# Patient Record
Sex: Female | Born: 1960 | Race: White | Hispanic: No | Marital: Married | Smoking: Never smoker
Health system: Southern US, Community
[De-identification: ages and names within clinical notes are randomized; demographics above are authoritative.]

## PROBLEM LIST (undated history)

## (undated) DIAGNOSIS — M1990 Unspecified osteoarthritis, unspecified site: Secondary | ICD-10-CM

## (undated) HISTORY — PX: ABDOMINAL HYSTERECTOMY: SHX81

## (undated) HISTORY — PX: HAND SURGERY: SHX662

---

## 2008-07-23 ENCOUNTER — Emergency Department (HOSPITAL_BASED_OUTPATIENT_CLINIC_OR_DEPARTMENT_OTHER): Admission: EM | Admit: 2008-07-23 | Discharge: 2008-07-23 | Payer: Self-pay | Admitting: Emergency Medicine

## 2008-07-25 ENCOUNTER — Ambulatory Visit (HOSPITAL_BASED_OUTPATIENT_CLINIC_OR_DEPARTMENT_OTHER): Admission: RE | Admit: 2008-07-25 | Discharge: 2008-07-25 | Payer: Self-pay | Admitting: Orthopedic Surgery

## 2008-12-01 ENCOUNTER — Ambulatory Visit: Payer: Self-pay | Admitting: Family Medicine

## 2008-12-01 DIAGNOSIS — N63 Unspecified lump in unspecified breast: Secondary | ICD-10-CM | POA: Insufficient documentation

## 2011-06-02 LAB — URINALYSIS, ROUTINE W REFLEX MICROSCOPIC
Glucose, UA: NEGATIVE
Hgb urine dipstick: NEGATIVE
Specific Gravity, Urine: 1.021

## 2017-03-24 ENCOUNTER — Encounter: Payer: Self-pay | Admitting: Emergency Medicine

## 2017-03-24 ENCOUNTER — Emergency Department (INDEPENDENT_AMBULATORY_CARE_PROVIDER_SITE_OTHER): Payer: BLUE CROSS/BLUE SHIELD

## 2017-03-24 ENCOUNTER — Emergency Department (INDEPENDENT_AMBULATORY_CARE_PROVIDER_SITE_OTHER)
Admission: EM | Admit: 2017-03-24 | Discharge: 2017-03-24 | Disposition: A | Discharge status: Home / Self Care | Payer: BLUE CROSS/BLUE SHIELD | Source: Home / Self Care | Attending: Family Medicine | Admitting: Family Medicine

## 2017-03-24 DIAGNOSIS — M5412 Radiculopathy, cervical region: Secondary | ICD-10-CM

## 2017-03-24 DIAGNOSIS — M542 Cervicalgia: Secondary | ICD-10-CM

## 2017-03-24 DIAGNOSIS — M47892 Other spondylosis, cervical region: Secondary | ICD-10-CM

## 2017-03-24 MED ORDER — PREDNISONE 20 MG PO TABS: ORAL_TABLET | ORAL | 0 refills | Status: DC

## 2017-03-24 NOTE — ED Provider Notes (Signed)
[facility]    CSN: [id number] Arrival date & time: 03/24/17  1209     History   Chief Complaint Chief Complaint  Patient presents with  . Neck Pain    HPI Pamela Wade is a 56 y.o. female.   Patient complains of dull pain and stiffness in her right neck for about one month.  During the past week she has had intermittent pain radiating to her right arm to her fingers.  She has increased pain when flexing her head to the left, and flexing her neck forward.  She occasionally has pain radiating to her right head.   The history is provided by the patient.  Neck Pain  Pain location:  R side Quality:  Aching Pain radiates to:  R arm, R forearm and R hand Pain severity:  Mild Pain is:  Worse during the day Onset quality:  Gradual Duration:  1 month Timing:  Intermittent Progression:  Worsening Chronicity:  New Context: not recent injury   Relieved by:  None tried Worsened by:  Position and bending Ineffective treatments:  None tried Associated symptoms: tingling   Associated symptoms: no fever, no headaches, no leg pain, no numbness, no paresis and no weakness   Risk factors: no hx of osteoporosis, no hx of spinal trauma and no recent head injury     History reviewed. No pertinent past medical history.  Patient Active Problem List   Diagnosis Date Noted  . LUMP OR MASS IN BREAST 12/01/2008    Past Surgical History:  Procedure Laterality Date  . ABDOMINAL HYSTERECTOMY      OB History    No data available       Home Medications    Prior to Admission medications   Medication Sig Start Date End Date Taking? Authorizing Provider  estradiol (ESTRACE) 0.5 MG tablet Take 0.5 mg by mouth daily.   Yes [provider]  meloxicam (MOBIC) 15 MG tablet Take 15 mg by mouth daily.   Yes [provider]  traZODone (DESYREL) 50 MG tablet Take 50 mg by mouth at bedtime.   Yes [provider]  predniSONE (DELTASONE) 20 MG tablet Take one  tab by mouth twice daily for 5 days, then one daily. Take with food. 03/24/17   Lattie HawBeese, Stephen A, MD    Family History Family History  Problem Relation Age of Onset  . Cancer Mother   . Cancer Father   . Cancer Sister     Social History Social History  Substance Use Topics  . Smoking status: Never Smoker  . Smokeless tobacco: Never Used  . Alcohol use No     Allergies   Penicillins   Review of Systems Review of Systems  Constitutional: Negative for fever.  Musculoskeletal: Positive for neck pain.  Neurological: Positive for tingling. Negative for weakness, numbness and headaches.  All other systems reviewed and are negative.    Physical Exam Triage Vital Signs ED Triage Vitals  Enc Vitals Group     BP      Pulse      Resp      Temp      Temp src      SpO2      Weight      Height      Head Circumference      Peak Flow      Pain Score      Pain Loc      Pain Edu?  Excl. in GC?    No data found.   Updated Vital Signs BP 103/62 (BP Location: Left Arm)   Pulse 67   Temp 98.4 F (36.9 C) (Oral)   Ht 5\' 1"  (1.549 m)   Wt 139 lb (63 kg)   SpO2 98%   BMI 26.26 kg/m   Visual Acuity Right Eye Distance:   Left Eye Distance:   Bilateral Distance:    Right Eye Near:   Left Eye Near:    Bilateral Near:     Physical Exam  Constitutional: She appears well-developed and well-nourished. No distress.  HENT:  Head: Normocephalic and atraumatic.  Right Ear: External ear normal.  Left Ear: External ear normal.  Nose: Nose normal.  Mouth/Throat: Oropharynx is clear and moist.  Eyes: Pupils are equal, round, and reactive to light. Conjunctivae and EOM are normal.  Neck: Normal range of motion. Neck supple. Muscular tenderness present. No spinous process tenderness present. Normal range of motion present. No thyromegaly present.    Tenderness over right trapezius muscle as noted on diagram.   Cardiovascular: Normal heart sounds.   Pulmonary/Chest:  Breath sounds normal.  Musculoskeletal: Normal range of motion.  Lymphadenopathy:    She has no cervical adenopathy.  Neurological: She is alert.  Skin: Skin is warm and dry. No rash noted.  Nursing note and vitals reviewed.    UC Treatments / Results  Labs (all labs ordered are listed, but only abnormal results are displayed) Labs Reviewed - No data to display  EKG  EKG Interpretation None       Radiology Dg Cervical Spine Complete  Result Date: 03/24/2017 CLINICAL DATA:  Neck pain radiating to the right arm, recent development. EXAM: CERVICAL SPINE - COMPLETE 4+ VIEW COMPARISON:  None. FINDINGS: Straightening of the normal cervical lordosis. 1 mm anterolisthesis at C3-4 because of facet arthropathy, right more than left. Left-sided facet osteoarthritis at C4-5 and C5-6. Disc space narrowing at C5-6 and C6-7. Mild osteophytic encroachment upon the foramina bilaterally at C5-6 and C6-7. 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 Electronically Signed   By: Paulina FusiMark  Shogry M.D.   On: 03/24/2017 13:01    Procedures Procedures (including critical care time)  Medications Ordered in UC Medications - No data to display   Initial Impression / Assessment and Plan / UC Course  I have reviewed the triage vital signs and the nursing notes.  Pertinent labs & imaging results that were available during my care of the patient were reviewed by me and considered in my medical decision making (see chart for details).    Dispensed soft cervical collar. Begin prednisone burst/taper. Apply ice pack for 20 to 30 minutes, 3 to 4 times daily  Continue until pain decreases.  Followup with Dr. Rodney Langtonhomas Thekkekandam for further evaluation and management.  Final Clinical Impressions(s) / UC Diagnoses   Final diagnoses:  Radiculopathy of cervical spine  Neck pain    New  Prescriptions New Prescriptions   PREDNISONE (DELTASONE) 20 MG TABLET    Take one tab by mouth twice daily for 5 days, then one daily. Take with food.     Lattie HawBeese, Stephen A, MD 03/24/17 1346

## 2017-03-24 NOTE — Discharge Instructions (Signed)
Apply ice pack for 20 minutes, 2 to 3 times daily  Continue until pain and swelling decrease.  Wear cervical collar intermittently.

## 2017-03-24 NOTE — ED Triage Notes (Signed)
Rt neck pain, radiates down rt shoulder and arm x 1 month

## 2017-03-30 ENCOUNTER — Ambulatory Visit (INDEPENDENT_AMBULATORY_CARE_PROVIDER_SITE_OTHER): Payer: BLUE CROSS/BLUE SHIELD | Admitting: Sports Medicine

## 2017-03-30 ENCOUNTER — Encounter: Payer: Self-pay | Admitting: Sports Medicine

## 2017-03-30 DIAGNOSIS — M5412 Radiculopathy, cervical region: Secondary | ICD-10-CM | POA: Insufficient documentation

## 2017-03-30 MED ORDER — MELOXICAM 15 MG PO TABS
ORAL_TABLET | ORAL | 3 refills | Status: DC
Start: 2017-03-30 — End: 2017-07-13

## 2017-03-30 NOTE — Progress Notes (Addendum)
   Subjective:    I'm seeing this patient as a consultation for:  Dr. Donna ChristenStephen Beese, Vicente MalesKyle Obendorf, PA-C  CC: Cervical radiculopathy  HPI: This is a pleasant 56 Year old female, she works at [employer]. For the past month or so she's had pain in her neck with radiation down the right arm to the ring finger. Numbness and tingling, denies any weakness or increase in clumsiness or dropping objects. No lower extremity symptoms. No constitutional symptoms or trauma. She was seen in urgent care and was treated with prednisone which is helped slightly only.  Past medical history, Surgical history, Family history not pertinant except as noted below, Social history, Allergies, and medications have been entered into the medical record, reviewed, and no changes needed.   Review of Systems: No headache, visual changes, nausea, vomiting, diarrhea, constipation, dizziness, abdominal pain, skin rash, fevers, chills, night sweats, weight loss, swollen lymph nodes, body aches, joint swelling, muscle aches, chest pain, shortness of breath, mood changes, visual or auditory hallucinations.   Objective:   General: Well Developed, well nourished, and in no acute distress.  Neuro:  Extra-ocular muscles intact, able to move all 4 extremities, sensation grossly intact.  Deep tendon reflexes tested were normal. Psych: Alert and oriented, mood congruent with affect. ENT:  Ears and nose appear unremarkable.  Hearing grossly normal. Neck: Unremarkable overall appearance, trachea midline.  No visible thyroid enlargement. Eyes: Conjunctivae and lids appear unremarkable.  Pupils equal and round. Skin: Warm and dry, no rashes noted.  Cardiovascular: Pulses palpable, no extremity edema. Neck: Negative spurling's Full neck range of motion Grip strength and sensation normal in bilateral hands Strength good C4 to T1 distribution No sensory change to C4 to T1 Reflexes normal  Cervical spine x-rays personally reviewed,  there is multilevel degenerative disc disease with anterior bridging osteophytes.  Impression and Recommendations:   This case required medical decision making of moderate complexity.  Right cervical radiculopathy Clinical C8 distribution. Increasing meloxicam 15 mg, prednisone was only minimally effective. 10 pound lifting restriction at work, aggressive physical therapy.  Return in 6 weeks.

## 2017-03-30 NOTE — Assessment & Plan Note (Signed)
Clinical C8 distribution. Increasing meloxicam 15 mg, prednisone was only minimally effective. 10 pound lifting restriction at work, aggressive physical therapy.  Return in 6 weeks.

## 2017-04-01 ENCOUNTER — Telehealth: Payer: Self-pay | Admitting: Emergency Medicine

## 2017-04-01 NOTE — Telephone Encounter (Signed)
Sent notes, etc to Dr Trena PlattJohn Morris, [facility], [phone] as per patient's husbands request. Called patient and let her know.

## 2017-04-01 NOTE — Telephone Encounter (Signed)
Husband called they would like a referral to Dr Trena PlattJohn Morris, [facility] [phone].

## 2017-05-14 ENCOUNTER — Ambulatory Visit: Payer: BLUE CROSS/BLUE SHIELD | Admitting: Sports Medicine

## 2017-07-13 ENCOUNTER — Other Ambulatory Visit: Payer: Self-pay | Admitting: Sports Medicine

## 2017-07-13 DIAGNOSIS — M5412 Radiculopathy, cervical region: Secondary | ICD-10-CM

## 2017-08-22 ENCOUNTER — Other Ambulatory Visit: Payer: Self-pay | Admitting: Sports Medicine

## 2017-08-22 DIAGNOSIS — M5412 Radiculopathy, cervical region: Secondary | ICD-10-CM

## 2017-09-19 ENCOUNTER — Other Ambulatory Visit: Payer: Self-pay | Admitting: Sports Medicine

## 2017-09-19 DIAGNOSIS — M5412 Radiculopathy, cervical region: Secondary | ICD-10-CM

## 2017-10-24 ENCOUNTER — Other Ambulatory Visit: Payer: Self-pay | Admitting: Sports Medicine

## 2017-10-24 DIAGNOSIS — M5412 Radiculopathy, cervical region: Secondary | ICD-10-CM

## 2017-11-23 ENCOUNTER — Other Ambulatory Visit: Payer: Self-pay | Admitting: Sports Medicine

## 2017-11-23 DIAGNOSIS — M5412 Radiculopathy, cervical region: Secondary | ICD-10-CM

## 2018-05-04 IMAGING — DX DG CERVICAL SPINE COMPLETE 4+V
6 series · 6 of 6 positions shown · non-contrast
Comparison: None.

CLINICAL DATA: Neck pain radiating to the right arm, recent
development.

EXAM:
CERVICAL SPINE - COMPLETE 4+ VIEW

[c-spine lat]
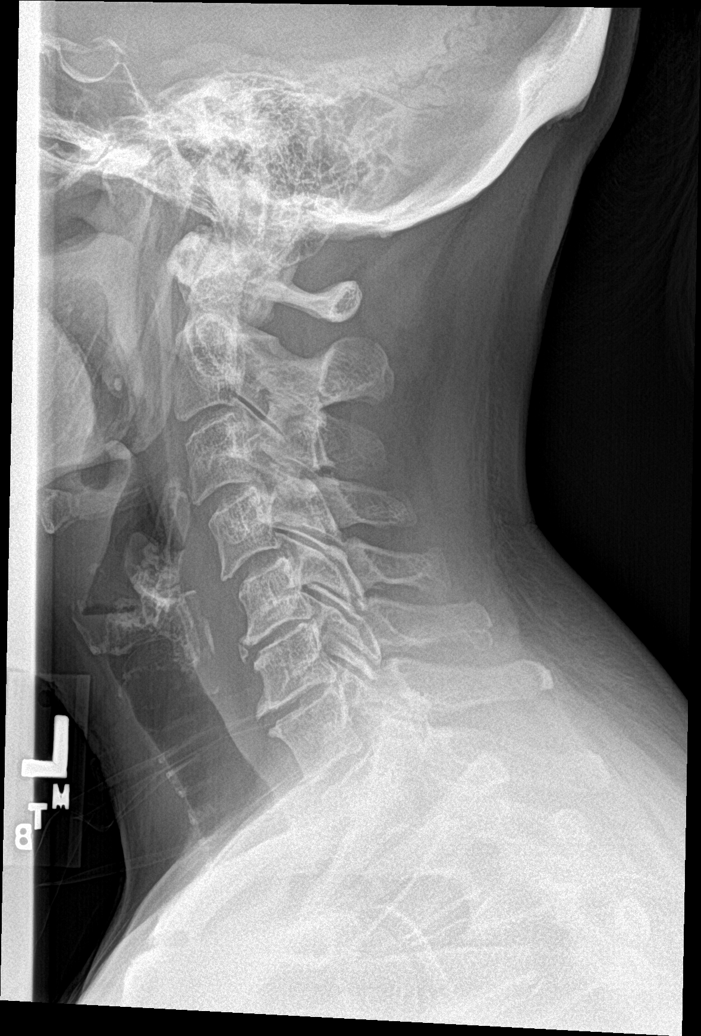

[c-spine obl (1 of 2)]
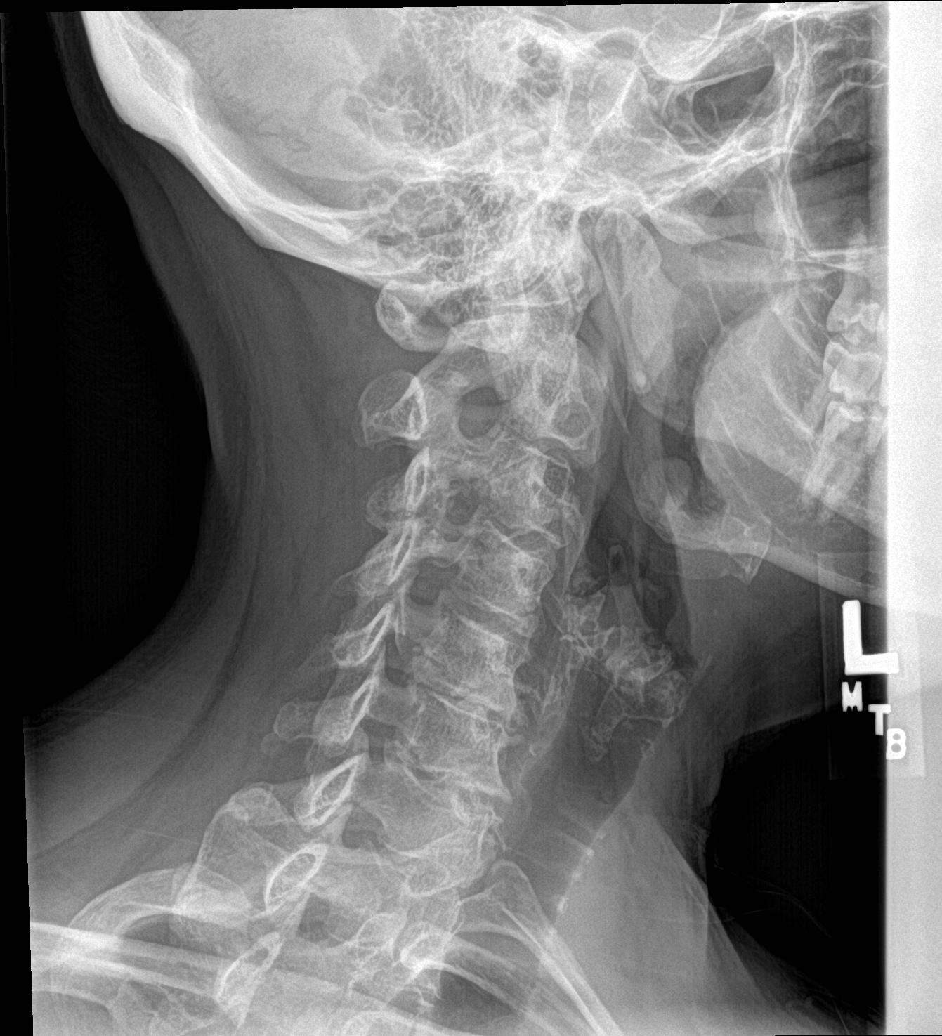

[c-spine obl (2 of 2)]
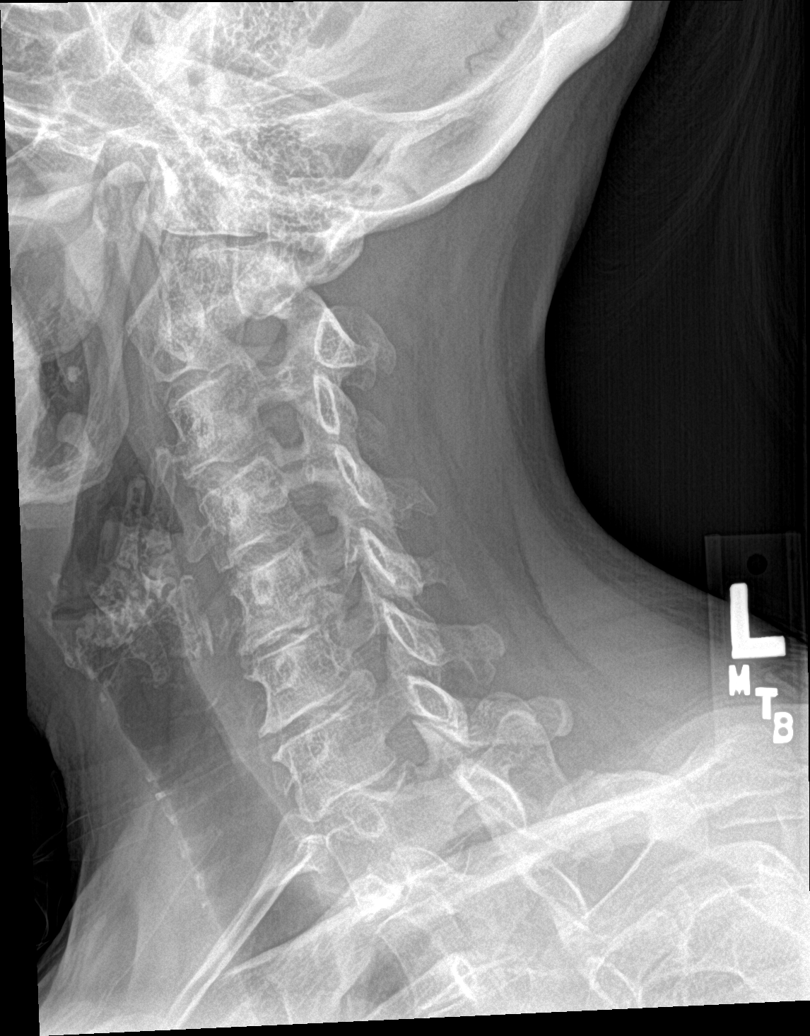

[c-spine ap]
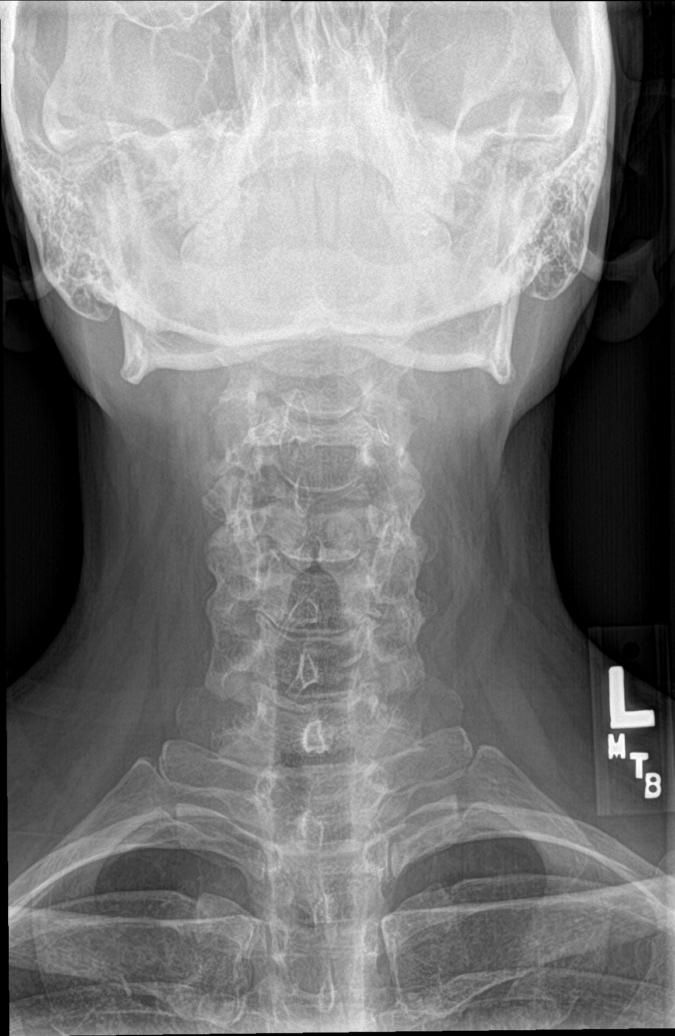

[c-spine open mouth]
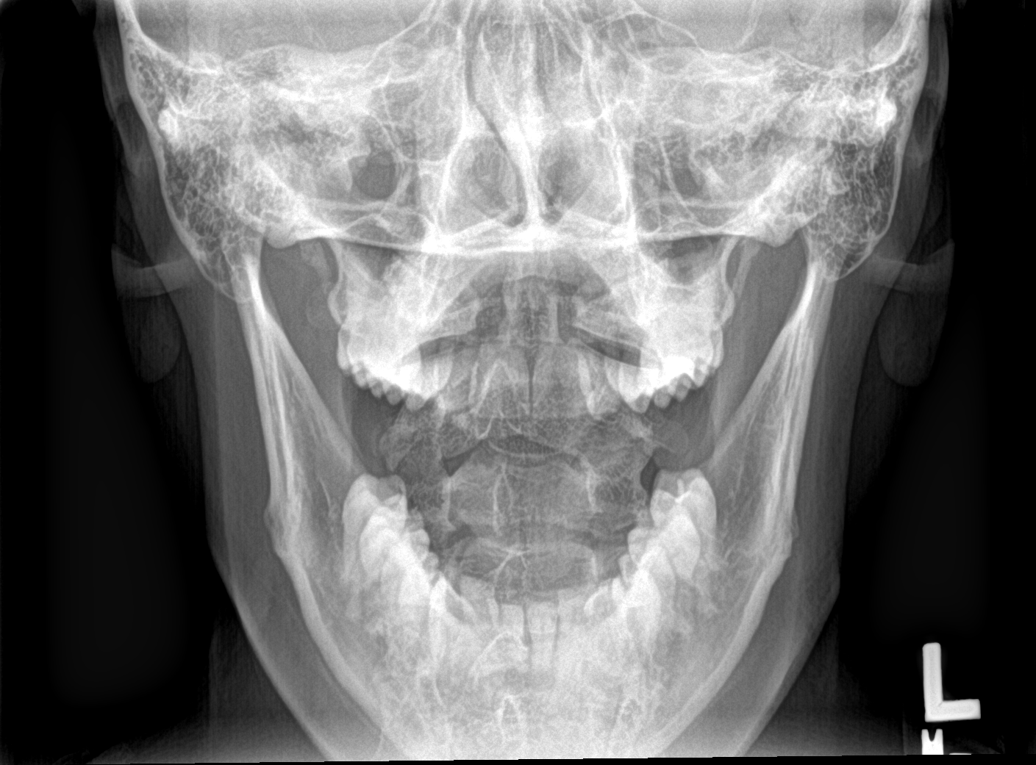

[[person_name]]
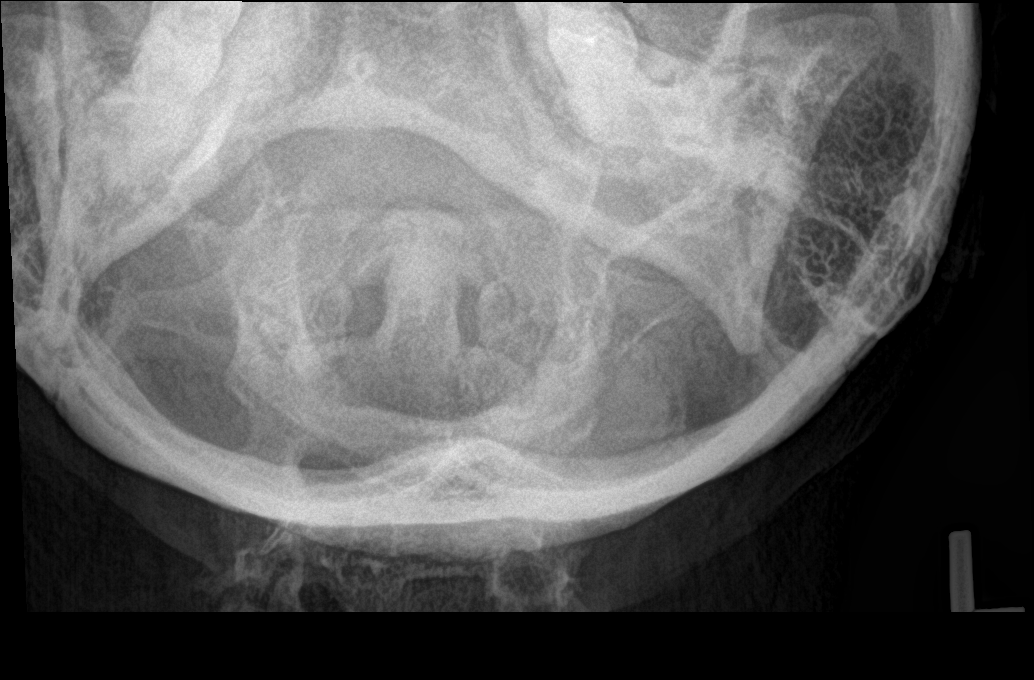

[6 of 6 positions shown; findings below may reference images not displayed]

FINDINGS: Straightening of the normal cervical lordosis. 1 mm anterolisthesis
at C3-4 because of facet arthropathy, right more than left.
Left-sided facet osteoarthritis at C4-5 and C5-6. Disc space
narrowing at C5-6 and C6-7. Mild osteophytic encroachment upon the
foramina bilaterally at C5-6 and C6-7.
IMPRESSION: Spondylosis at C5-6 and C6-7 with mild bony foraminal narrowing.

Facet osteoarthritis most pronounced on the right at C3-4 and on the
left at C4-5 and C5-6.

## 2020-08-27 ENCOUNTER — Emergency Department
Admission: EM | Admit: 2020-08-27 | Discharge: 2020-08-27 | Disposition: A | Discharge status: Home / Self Care | Payer: BC Managed Care – PPO | Source: Home / Self Care | Attending: Internal Medicine | Admitting: Internal Medicine

## 2020-08-27 ENCOUNTER — Other Ambulatory Visit: Payer: Self-pay

## 2020-08-27 DIAGNOSIS — R059 Cough, unspecified: Secondary | ICD-10-CM | POA: Diagnosis not present

## 2020-08-27 DIAGNOSIS — J208 Acute bronchitis due to other specified organisms: Secondary | ICD-10-CM

## 2020-08-27 MED ORDER — GUAIFENESIN ER 600 MG PO TB12: 600.0000 mg | ORAL_TABLET | Freq: Two times a day (BID) | ORAL | 0 refills | Status: AC

## 2020-08-27 MED ORDER — BENZONATATE 100 MG PO CAPS: 100.0000 mg | ORAL_CAPSULE | Freq: Three times a day (TID) | ORAL | 0 refills | Status: AC

## 2020-08-27 MED ORDER — ACETAMINOPHEN 325 MG PO TABS: 650.0000 mg | ORAL_TABLET | Freq: Four times a day (QID) | ORAL | Status: AC | PRN

## 2020-08-27 NOTE — ED Triage Notes (Signed)
Patient presents to Urgent Care with complaints of cough. Patient reports she thinks she has bronchitis, the cough has made her throat hurt as well. Pt has been vaccinated for covid.

## 2020-08-27 NOTE — Discharge Instructions (Addendum)
Please take medications as directed This is likely viral bronchitis Increase oral fluid intake If symptoms worsen please return to the urgent care.

## 2020-08-27 NOTE — ED Provider Notes (Signed)
[facility]    CSN: [id number] Arrival date & time: 08/27/20  9381      History   Chief Complaint Chief Complaint  Patient presents with   Cough    HPI Pamela Wade is a 59 y.o. female comes to the urgent care with complaints of cough which is nonproductive.  Symptoms started a couple of days ago and has been persistent.  Cough is associated with some sore throat.  No sick contacts.  No fever or chills.  No nausea, vomiting or diarrhea.  No abdominal pain.  Patient is fully vaccinated against COVID-19 virus.  No shortness of breath, chest pain or chest pressure.  No loss of taste or smell.   HPI  History reviewed. No pertinent past medical history.  Patient Active Problem List   Diagnosis Date Noted   Right cervical radiculopathy 03/30/2017   LUMP OR MASS IN BREAST 12/01/2008    Past Surgical History:  Procedure Laterality Date   ABDOMINAL HYSTERECTOMY      OB History   No obstetric history on file.      Home Medications    Prior to Admission medications   Medication Sig Start Date End Date Taking? Authorizing Provider  acetaminophen (TYLENOL) 325 MG tablet Take 2 tablets (650 mg total) by mouth every 6 (six) hours as needed. 08/27/20  Yes Soham Hollett, Britta Mccreedy, MD  benzonatate (TESSALON) 100 MG capsule Take 1 capsule (100 mg total) by mouth every 8 (eight) hours. 08/27/20  Yes Zhara Gieske, Britta Mccreedy, MD  celecoxib (CELEBREX) 50 MG capsule Take 50 mg by mouth 2 (two) times daily.   Yes [provider]  guaiFENesin (MUCINEX) 600 MG 12 hr tablet Take 1 tablet (600 mg total) by mouth 2 (two) times daily for 10 days. 08/27/20 09/06/20 Yes Demira Gwynne, Britta Mccreedy, MD  traZODone (DESYREL) 50 MG tablet Take 100 mg by mouth at bedtime.   Yes [provider]  estradiol (ESTRACE) 0.5 MG tablet Take 0.5 mg by mouth daily.    [provider]  meloxicam (MOBIC) 15 MG tablet TAKE 1 TABLET BY MOUTH EVERY MORNING WITH BREAKFAST FOR 2 WEEKS AS NEEDED FOR  PAIN 10/25/17   Monica Becton, MD    Family History Family History  Problem Relation Age of Onset   Cancer Mother    Cancer Father    Cancer Sister     Social History Social History   Tobacco Use   Smoking status: Never Smoker   Smokeless tobacco: Never Used  Substance Use Topics   Alcohol use: No   Drug use: No     Allergies   Erythromycin and Penicillins   Review of Systems Review of Systems  HENT: Positive for sore throat.   Respiratory: Positive for cough.   Gastrointestinal: Negative for abdominal pain, diarrhea, nausea and vomiting.  Neurological: Negative for headaches.     Physical Exam Triage Vital Signs ED Triage Vitals  Enc Vitals Group     BP 08/27/20 1013 127/79     Pulse Rate 08/27/20 1013 76     Resp 08/27/20 1013 16     Temp 08/27/20 1013 98.7 F (37.1 C)     Temp Source 08/27/20 1013 Oral     SpO2 08/27/20 1013 100 %     Weight --      Height --      Head Circumference --      Peak Flow --      Pain Score 08/27/20 1010 3  Pain Loc --      Pain Edu? --      Excl. in GC? --    No data found.  Updated Vital Signs BP 127/79 (BP Location: Right Arm)    Pulse 76    Temp 98.7 F (37.1 C) (Oral)    Resp 16    SpO2 100%   Visual Acuity Right Eye Distance:   Left Eye Distance:   Bilateral Distance:    Right Eye Near:   Left Eye Near:    Bilateral Near:     Physical Exam   UC Treatments / Results  Labs (all labs ordered are listed, but only abnormal results are displayed) Labs Reviewed  NOVEL CORONAVIRUS, NAA    EKG   Radiology No results found.  Procedures Procedures (including critical care time)  Medications Ordered in UC Medications - No data to display  Initial Impression / Assessment and Plan / UC Course  I have reviewed the triage vital signs and the nursing notes.  Pertinent labs & imaging results that were available during my care of the patient were reviewed by me and considered in my  medical decision making (see chart for details).     1.  Acute viral bronchitis: Tessalon Perles as needed for cough Mucinex as needed Tylenol as needed for pain and/or fever Increase oral fluid intake COVID-19 PCR test sent Please quarantine until COVID-19 test results are available If symptoms worsen please return to urgent care to be reevaluated. Final Clinical Impressions(s) / UC Diagnoses   Final diagnoses:  Cough  Acute viral bronchitis     Discharge Instructions     Please take medications as directed This is likely viral bronchitis Increase oral fluid intake If symptoms worsen please return to the urgent care.   ED Prescriptions    Medication Sig Dispense Auth. Provider   benzonatate (TESSALON) 100 MG capsule Take 1 capsule (100 mg total) by mouth every 8 (eight) hours. 21 capsule Lalaine Overstreet, Britta Mccreedy, MD   guaiFENesin (MUCINEX) 600 MG 12 hr tablet Take 1 tablet (600 mg total) by mouth 2 (two) times daily for 10 days. 20 tablet Graeme Menees, Britta Mccreedy, MD   acetaminophen (TYLENOL) 325 MG tablet Take 2 tablets (650 mg total) by mouth every 6 (six) hours as needed.  Daleon Willinger, Britta Mccreedy, MD     PDMP not reviewed this encounter.   Merrilee Jansky, MD 08/27/20 [phone]

## 2020-08-29 LAB — NOVEL CORONAVIRUS, NAA: SARS-CoV-2, NAA: DETECTED — AB

## 2020-08-29 LAB — SARS-COV-2, NAA 2 DAY TAT

## 2020-12-21 ENCOUNTER — Other Ambulatory Visit: Payer: Self-pay

## 2020-12-21 ENCOUNTER — Emergency Department
Admission: EM | Admit: 2020-12-21 | Discharge: 2020-12-21 | Disposition: A | Discharge status: Home / Self Care | Payer: BC Managed Care – PPO | Source: Home / Self Care | Attending: Family Medicine | Admitting: Family Medicine

## 2020-12-21 DIAGNOSIS — J111 Influenza due to unidentified influenza virus with other respiratory manifestations: Secondary | ICD-10-CM | POA: Diagnosis not present

## 2020-12-21 DIAGNOSIS — Z20828 Contact with and (suspected) exposure to other viral communicable diseases: Secondary | ICD-10-CM | POA: Diagnosis not present

## 2020-12-21 HISTORY — DX: Unspecified osteoarthritis, unspecified site: M19.90

## 2020-12-21 MED ORDER — OSELTAMIVIR PHOSPHATE 75 MG PO CAPS: 75.0000 mg | ORAL_CAPSULE | Freq: Two times a day (BID) | ORAL | 0 refills | Status: AC

## 2020-12-21 NOTE — ED Provider Notes (Signed)
[name]    CSN: [id number] Arrival date & time: 12/21/20  2505      History   Chief Complaint Chief Complaint  Patient presents with  . Cough  . Generalized Body Aches  . Fever    HPI Pamela Wade is a 60 y.o. female.   HPI   Patient is a healthy 60 year old.  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  She states several people in her workplace are out with influenza.  She states that yesterday she developed sudden onset of fever and chills, coughing and runny nose.  Feels tired and weak.  Has a headache.  Patient states she had COVID in December 2021.  She has been COVID vaccinated and took a COVID test yesterday that was negative.  Past Medical History:  Diagnosis Date  . Arthritis     Patient Active Problem List   Diagnosis Date Noted  . Right cervical radiculopathy 03/30/2017  . LUMP OR MASS IN BREAST 12/01/2008    Past Surgical History:  Procedure Laterality Date  . ABDOMINAL HYSTERECTOMY    . HAND SURGERY      OB History   No obstetric history on file.      Home Medications    Prior to Admission medications   Medication Sig Start Date End Date Taking? Authorizing Provider  oseltamivir (TAMIFLU) 75 MG capsule Take 1 capsule (75 mg total) by mouth every 12 (twelve) hours. 12/21/20  Yes Eustace Moore, MD  acetaminophen (TYLENOL) 325 MG tablet Take 2 tablets (650 mg total) by mouth every 6 (six) hours as needed. 08/27/20   Lamptey, Britta Mccreedy, MD  benzonatate (TESSALON) 100 MG capsule Take 1 capsule (100 mg total) by mouth every 8 (eight) hours. 08/27/20   LampteyBritta Mccreedy, MD  celecoxib (CELEBREX) 50 MG capsule Take 50 mg by mouth 2 (two) times daily.    [provider]  estradiol (ESTRACE) 0.5 MG tablet Take 0.5 mg by mouth daily.    [provider]  traZODone (DESYREL) 50 MG tablet Take 100 mg by mouth at bedtime.    [provider]    Family  History Family History  Problem Relation Age of Onset  . Cancer Mother   . Cancer Father   . Cancer Sister     Social History Social History   Tobacco Use  . Smoking status: Never Smoker  . Smokeless tobacco: Never Used  Vaping Use  . Vaping Use: Never used  Substance Use Topics  . Alcohol use: No  . Drug use: No     Allergies   Erythromycin and Penicillins   Review of Systems Review of Systems See HPI  Physical Exam Triage Vital Signs ED Triage Vitals  Enc Vitals Group     BP 12/21/20 1007 96/67     Pulse Rate 12/21/20 1007 84     Resp 12/21/20 1007 20     Temp 12/21/20 1007 99.5 F (37.5 C)     Temp Source 12/21/20 1007 Oral     SpO2 12/21/20 1007 95 %     Weight 12/21/20 1002 140 lb (63.5 kg)     Height 12/21/20 1002 5\' 1"  (1.549 m)     Head Circumference --      Peak Flow --      Pain Score 12/21/20 1001 5     Pain Loc --      Pain Edu? --  Excl. in GC? --    No data found.  Updated Vital Signs BP (!) 86/58 (BP Location: Left Arm)   Pulse 84   Temp 99.5 F (37.5 C) (Oral)   Resp 20   Ht 5\' 1"  (1.549 m)   Wt 63.5 kg   SpO2 95%   BMI 26.45 kg/m     Physical Exam Constitutional:      General: She is not in acute distress.    Appearance: She is well-developed. She is ill-appearing.     Comments: Appears tired  HENT:     Head: Normocephalic and atraumatic.     Right Ear: Tympanic membrane, ear canal and external ear normal.     Left Ear: Tympanic membrane and ear canal normal.     Nose: Congestion present.     Mouth/Throat:     Mouth: Mucous membranes are dry.     Pharynx: No posterior oropharyngeal erythema.  Eyes:     Conjunctiva/sclera: Conjunctivae normal.     Pupils: Pupils are equal, round, and reactive to light.  Cardiovascular:     Rate and Rhythm: Normal rate and regular rhythm.     Heart sounds: Normal heart sounds.  Pulmonary:     Effort: Pulmonary effort is normal. No respiratory distress.     Breath sounds: Normal  breath sounds.  Abdominal:     General: There is no distension.     Palpations: Abdomen is soft.     Tenderness: There is no abdominal tenderness.  Musculoskeletal:        General: Normal range of motion.     Cervical back: Normal range of motion.  Skin:    General: Skin is warm and dry.  Neurological:     Mental Status: She is alert.  Psychiatric:        Behavior: Behavior normal.      UC Treatments / Results  Labs (all labs ordered are listed, but only abnormal results are displayed) Labs Reviewed  COVID-19, FLU A+B NAA    EKG   Radiology No results found.  Procedures Procedures (including critical care time)  Medications Ordered in UC Medications - No data to display  Initial Impression / Assessment and Plan / UC Course  I have reviewed the triage vital signs and the nursing notes.  Pertinent labs & imaging results that were available during my care of the patient were reviewed by me and considered in my medical decision making (see chart for details).     Patient has runny nose, dry cough, appears to be fatigued.  High likelihood of influenza given direct exposure.  Will treat with Tamiflu.  I have advised her to check MyChart for her swab result.  Advised to stay out of work until symptoms are improved and fever is gone Final Clinical Impressions(s) / UC Diagnoses   Final diagnoses:  Influenza-like illness  Exposure to influenza     Discharge Instructions     Take Tamiflu twice a day for 5 days Continue Tessalon as needed for cough Drink plenty of fluids May return to work when your symptoms have improved and fever is gone   ED Prescriptions    Medication Sig Dispense Auth. Provider   oseltamivir (TAMIFLU) 75 MG capsule Take 1 capsule (75 mg total) by mouth every 12 (twelve) hours. 10 capsule , MD     PDMP not reviewed this encounter.   Eustace Moore, MD 12/21/20 1050

## 2020-12-21 NOTE — ED Triage Notes (Addendum)
Pt presents to Urgent Care with c/o cough, body aches, and fever since yesterday. Reports being exposed to the flu at work and has not received the flu vaccine. Negative home COVID test yesterday. Pt also c/o R ear pain and scratchy throat.

## 2020-12-21 NOTE — Discharge Instructions (Signed)
Take Tamiflu twice a day for 5 days Continue Tessalon as needed for cough Drink plenty of fluids May return to work when your symptoms have improved and fever is gone

## 2020-12-23 LAB — COVID-19, FLU A+B NAA
Influenza A, NAA: DETECTED — AB
Influenza B, NAA: NOT DETECTED
SARS-CoV-2, NAA: NOT DETECTED
